# Patient Record
Sex: Male | Born: 1975 | Race: Black or African American | Hispanic: No | Marital: Single | Smoking: Current some day smoker
Health system: Southern US, Community
[De-identification: ages and names within clinical notes are randomized; demographics above are authoritative.]

## PROBLEM LIST (undated history)

## (undated) DIAGNOSIS — N200 Calculus of kidney: Secondary | ICD-10-CM

---

## 2011-09-29 ENCOUNTER — Emergency Department (HOSPITAL_COMMUNITY)
Admission: EM | Admit: 2011-09-29 | Discharge: 2011-09-29 | Disposition: A | Discharge status: Home / Self Care | Payer: Self-pay | Attending: Emergency Medicine | Admitting: Emergency Medicine

## 2011-09-29 DIAGNOSIS — R079 Chest pain, unspecified: Secondary | ICD-10-CM | POA: Insufficient documentation

## 2011-09-29 DIAGNOSIS — Z79899 Other long term (current) drug therapy: Secondary | ICD-10-CM | POA: Insufficient documentation

## 2011-09-29 DIAGNOSIS — R197 Diarrhea, unspecified: Secondary | ICD-10-CM | POA: Insufficient documentation

## 2011-09-29 DIAGNOSIS — J069 Acute upper respiratory infection, unspecified: Secondary | ICD-10-CM | POA: Insufficient documentation

## 2011-09-29 DIAGNOSIS — K5289 Other specified noninfective gastroenteritis and colitis: Secondary | ICD-10-CM | POA: Insufficient documentation

## 2011-09-29 DIAGNOSIS — R059 Cough, unspecified: Secondary | ICD-10-CM | POA: Insufficient documentation

## 2011-09-29 DIAGNOSIS — R05 Cough: Secondary | ICD-10-CM | POA: Insufficient documentation

## 2011-09-29 DIAGNOSIS — M549 Dorsalgia, unspecified: Secondary | ICD-10-CM | POA: Insufficient documentation

## 2011-09-29 DIAGNOSIS — R509 Fever, unspecified: Secondary | ICD-10-CM | POA: Insufficient documentation

## 2011-09-29 DIAGNOSIS — K529 Noninfective gastroenteritis and colitis, unspecified: Secondary | ICD-10-CM

## 2011-09-29 MED ORDER — AZITHROMYCIN 250 MG PO TABS: 250.0000 mg | ORAL_TABLET | Freq: Every day | ORAL | Status: AC

## 2011-09-29 NOTE — ED Provider Notes (Signed)
This chart was scribed for Darrell Jakes, MD by Darrell Moss. The patient was seen in room APFT21/APFT21 and the patient's care was started at 3:57 PM.   CSN: [id number] Arrival date & time: 09/29/2011  2:19 PM   First MD Initiated Contact with Patient 09/29/11 1549      Chief Complaint  Patient presents with  . Cough  . Back Pain    (Consider location/radiation/quality/duration/timing/severity/associated sxs/prior treatment) Patient is a 35 y.o. male presenting with cough and back pain. The history is provided by the patient.  Cough This is a new problem. The current episode started more than 2 days ago. The problem occurs constantly. The problem has been gradually worsening. The cough is productive of sputum. The maximum temperature recorded prior to his arrival was 100 to 100.9 F. The fever has been present for 1 to 2 days. Associated symptoms include chest pain. Pertinent negatives include no ear pain, no headaches and no myalgias. He has tried nothing for the symptoms.  Back Pain  Associated symptoms include chest pain and a fever. Pertinent negatives include no headaches.   3:57 PM Sergei Delo is a 35 y.o. male who presents to the Emergency Department complaining of sudden onset, constant, productive cough that began 2 days ago. Cough is productive of light green phlegm. Pt c/o associated mild fever, upper back pain, and diarrhea, but has had no episodes of diarrhea today. Pt c/o chest and upper back pain when coughing.  Pt denies vomiting, blood in diarrhea, abdominal pain, sore throat, headache, aches, rash, swelling in ankles, neck pain. Pt confirms positive sick contact.    History reviewed. No pertinent past medical history.  History reviewed. No pertinent past surgical history.  No family history on file.  History  Substance Use Topics  . Smoking status: Current Everyday Smoker  . Smokeless tobacco: Not on file  . Alcohol Use: No      Review of Systems    Constitutional: Positive for fever.  HENT: Negative for ear pain and neck pain.   Respiratory: Positive for cough.   Cardiovascular: Positive for chest pain.  Musculoskeletal: Positive for back pain. Negative for myalgias.  Neurological: Negative for headaches.   10 Systems reviewed and are negative for acute change except as noted in the HPI.  Allergies  Review of patient's allergies indicates no known allergies.  Home Medications   Current Outpatient Rx  Name Route Sig Dispense Refill  . ACETAMINOPHEN 500 MG PO TABS Oral Take 500 mg by mouth every 6 (six) hours as needed. Pain     . CHLORPHEN-PSEUDOEPHED-APAP 2-30-500 MG PO TABS Oral Take 1 tablet by mouth every 4 (four) hours as needed.      Marland Kitchen DAYQUIL PO Oral Take 2 capsules by mouth every 8 (eight) hours as needed. Cold Symptoms     . AZITHROMYCIN 250 MG PO TABS Oral Take 1 tablet (250 mg total) by mouth daily. Take first 2 tablets together, then 1 every day until finished. 6 tablet 0    BP 143/83  Pulse 94  Temp(Src) 99.9 F (37.7 C) (Oral)  Resp 22  Ht 5\' 11"  (1.803 m)  Wt 304 lb (137.893 kg)  BMI 42.40 kg/m2  SpO2 98%  Physical Exam  Nursing note and vitals reviewed. Constitutional: He is oriented to person, place, and time. He appears well-developed and well-nourished. No distress.  HENT:  Head: Normocephalic and atraumatic.  Eyes: EOM are normal. Pupils are equal, round, and reactive to light.  Neck:  Normal range of motion. Neck supple. No tracheal deviation present.  Cardiovascular: Normal rate, regular rhythm and normal heart sounds.   Pulmonary/Chest: Effort normal and breath sounds normal. No respiratory distress. He has no wheezes.  Abdominal: Soft. Bowel sounds are normal. He exhibits no distension.  Musculoskeletal: Normal range of motion. He exhibits no edema.  Lymphadenopathy:    He has no cervical adenopathy.  Neurological: He is alert and oriented to person, place, and time. No sensory deficit.   Skin: Skin is warm and dry.  Psychiatric: He has a normal mood and affect. His behavior is normal.    ED Course  Procedures (including critical care time) DIAGNOSTIC STUDIES: Oxygen Saturation is 98% on room air, normal by my interpretation.    COORDINATION OF CARE:     Labs Reviewed - No data to display No results found.   1. Upper respiratory infection   2. Gastroenteritis       MDM  Patient with combination of symptoms that included rest for infection and some vomiting and diarrhea vomiting and diarrhea has improved, cough is productive of some muscular pain with coughing to the chest and back, sitting chest x-ray will give a course of Zithromax will cover of pneumonia this may very well be overall viral illness. Patient is nontoxic in no acute distress. Also recommended starting the Motrin Aleve Advil or Naprosyn, along with Robitussin-DM for the cough.     I personally performed the services described in this documentation, which was scribed in my presence. The recorded information has been reviewed and considered.         Darrell Jakes, MD 09/29/11 [phone]

## 2011-09-29 NOTE — ED Notes (Signed)
Pt c/o cough x 3 days with low grade fever.  Also c/o upper back pain.  Pt says has had some episodes of diarrhea x 3 days.

## 2012-11-17 ENCOUNTER — Emergency Department (HOSPITAL_COMMUNITY): Payer: Self-pay

## 2012-11-17 ENCOUNTER — Emergency Department (HOSPITAL_COMMUNITY)
Admission: EM | Admit: 2012-11-17 | Discharge: 2012-11-17 | Disposition: A | Discharge status: Home / Self Care | Payer: Self-pay | Attending: Emergency Medicine | Admitting: Emergency Medicine

## 2012-11-17 ENCOUNTER — Encounter (HOSPITAL_COMMUNITY): Payer: Self-pay

## 2012-11-17 DIAGNOSIS — R61 Generalized hyperhidrosis: Secondary | ICD-10-CM | POA: Insufficient documentation

## 2012-11-17 DIAGNOSIS — R110 Nausea: Secondary | ICD-10-CM | POA: Insufficient documentation

## 2012-11-17 DIAGNOSIS — N201 Calculus of ureter: Secondary | ICD-10-CM | POA: Insufficient documentation

## 2012-11-17 DIAGNOSIS — F17200 Nicotine dependence, unspecified, uncomplicated: Secondary | ICD-10-CM | POA: Insufficient documentation

## 2012-11-17 LAB — CBC WITH DIFFERENTIAL/PLATELET
Basophils Absolute: 0 10*3/uL (ref 0.0–0.1)
Basophils Relative: 1 % (ref 0–1)
Eosinophils Absolute: 0.2 10*3/uL (ref 0.0–0.7)
Hemoglobin: 15.1 g/dL (ref 13.0–17.0)
MCH: 29.8 pg (ref 26.0–34.0)
MCHC: 34.6 g/dL (ref 30.0–36.0)
Neutro Abs: 4.1 10*3/uL (ref 1.7–7.7)
Neutrophils Relative %: 50 % (ref 43–77)
Platelets: 355 10*3/uL (ref 150–400)

## 2012-11-17 LAB — BASIC METABOLIC PANEL
Chloride: 101 mEq/L (ref 96–112)
GFR calc Af Amer: >90 mL/min (ref >90)
GFR calc non Af Amer: 87 mL/min — ABNORMAL LOW (ref >90)
Potassium: 3.6 mEq/L (ref 3.5–5.1)
Sodium: 137 mEq/L (ref 135–145)

## 2012-11-17 LAB — URINALYSIS, ROUTINE W REFLEX MICROSCOPIC
Bilirubin Urine: NEGATIVE
Leukocytes, UA: NEGATIVE
Nitrite: NEGATIVE
Specific Gravity, Urine: >1.03 — ABNORMAL HIGH (ref 1.005–1.030)
Urobilinogen, UA: 0.2 mg/dL (ref 0.0–1.0)
pH: 6 (ref 5.0–8.0)

## 2012-11-17 LAB — URINE MICROSCOPIC-ADD ON

## 2012-11-17 MED ORDER — TAMSULOSIN HCL 0.4 MG PO CAPS: ORAL_CAPSULE | ORAL | Status: DC

## 2012-11-17 MED ORDER — SODIUM CHLORIDE 0.9 % IV SOLN
Freq: Once | INTRAVENOUS | Status: AC
  Administered 2012-11-17: 1000 mL via INTRAVENOUS

## 2012-11-17 MED ORDER — PERCOCET 5-325 MG PO TABS: 1.0000 | ORAL_TABLET | Freq: Four times a day (QID) | ORAL | Status: DC | PRN

## 2012-11-17 MED ORDER — KETOROLAC TROMETHAMINE 30 MG/ML IJ SOLN
30.0000 mg | Freq: Once | INTRAMUSCULAR | Status: AC
  Administered 2012-11-17: 30 mg via INTRAVENOUS
  Filled 2012-11-17: qty 1

## 2012-11-17 MED ORDER — ONDANSETRON HCL 4 MG/2ML IJ SOLN
4.0000 mg | Freq: Once | INTRAMUSCULAR | Status: AC
  Administered 2012-11-17: 4 mg via INTRAVENOUS
  Filled 2012-11-17: qty 2

## 2012-11-17 MED ORDER — PROMETHAZINE HCL 25 MG PO TABS: 25.0000 mg | ORAL_TABLET | Freq: Four times a day (QID) | ORAL | Status: DC | PRN

## 2012-11-17 MED ORDER — MORPHINE SULFATE 4 MG/ML IJ SOLN
4.0000 mg | Freq: Once | INTRAMUSCULAR | Status: AC
  Administered 2012-11-17: 4 mg via INTRAVENOUS
  Filled 2012-11-17: qty 1

## 2012-11-17 MED ORDER — HYDROMORPHONE HCL PF 1 MG/ML IJ SOLN
1.0000 mg | Freq: Once | INTRAMUSCULAR | Status: AC
  Administered 2012-11-17: 1 mg via INTRAVENOUS
  Filled 2012-11-17: qty 1

## 2012-11-17 NOTE — ED Notes (Signed)
Pt reports has been having some left lower back pain on and off for several months.  Reports pain got much worse today.  Reports pain radiates around to left lower abd and urine has been dark.  Reports dry heaving this morning.  Denies history of kidney stones.  Pt took one of his wife's keflex this morning.

## 2012-11-17 NOTE — ED Provider Notes (Signed)
History   This chart was scribed for Ward Givens, MD by Charolett Bumpers, ED Scribe. The patient was seen in room APA04/APA04. Patient's care was started at 0836.   CSN: [id number]  Arrival date & time 11/17/12  0823   First MD Initiated Contact with Patient 11/17/12 [phone]      Chief Complaint  Patient presents with  . Flank Pain    The history is provided by the patient. No language interpreter was used.   Darrell Moss is a 37 y.o. male who presents to the Emergency Department complaining of sharp left-sided flank pain that started this morning around 7 am after waking that radiates into his lateral abdomen. He rates his pain 8/10 currently and 10/10 at it's worse. He reports he has had the same left flank pain intermittently for the past year but worsened this morning. He states the pain normally lasts for a couple days at a time and is aggravated with sleeping on his left side. He states that he has taken Tylenol and Ibuprofen with mild relief in the past. He states the pain was much worse this morning and is now aggravated with movements. He states that he feels he cannot have a BM and has had diaphoresis, nausea and dark urine that he noticed last night. He reports his urine has been intermittently dark over the past month. He denies any vomiting or fevers. He denies any recent increases in activity, falls or trauma. He denies any prior medical hx and has no regular medications. He is unsure of family h/o kidney stones. He felt at baseline yesterday.   No PCP.  History reviewed. No pertinent past medical history.  History reviewed. No pertinent past surgical history.  No family history on file. Both grandmothers with HTN, DM   History  Substance Use Topics  . Smoking status: Current Every Day Smoker  . Smokeless tobacco: Not on file  . Alcohol Use: Yes     Comment: occ  Admits to smoking a pack over 2-3 days Admits to occasional alcohol, drinks on weekends about 1  pint Unemployed Lives at home Lives with spouse  Review of Systems  Constitutional: Positive for diaphoresis. Negative for fever.  Gastrointestinal: Positive for nausea. Negative for vomiting.  Genitourinary: Positive for flank pain.       Dark urine  All other systems reviewed and are negative.    Allergies  Review of patient's allergies indicates no known allergies.  Home Medications   Current Outpatient Rx  Name  Route  Sig  Dispense  Refill  . ACETAMINOPHEN 500 MG PO TABS   Oral   Take 500 mg by mouth every 6 (six) hours as needed. Pain          . CHLORPHEN-PSEUDOEPHED-APAP 2-30-500 MG PO TABS   Oral   Take 1 tablet by mouth every 4 (four) hours as needed.           Marland Kitchen DAYQUIL PO   Oral   Take 2 capsules by mouth every 8 (eight) hours as needed. Cold Symptoms            Triage Vitals: BP 162/109  Pulse 80  Temp 98.2 F (36.8 C) (Oral)  Resp 20  Ht 5\' 11"  (1.803 m)  Wt 305 lb (138.347 kg)  BMI 42.54 kg/m2  SpO2 99%  Vital signs normal except hypertension   Physical Exam  Nursing note and vitals reviewed. Constitutional: He is oriented to person, place, and time. He appears  well-developed and well-nourished. No distress.       obese  HENT:  Head: Normocephalic and atraumatic.  Right Ear: External ear normal.  Left Ear: External ear normal.  Nose: Nose normal.  Mouth/Throat: Oropharynx is clear and moist. No oropharyngeal exudate.  Eyes: Conjunctivae normal and EOM are normal. Pupils are equal, round, and reactive to light.  Neck: Normal range of motion. Neck supple. No tracheal deviation present.  Cardiovascular: Normal rate, regular rhythm and normal heart sounds.  Exam reveals no gallop and no friction rub.   No murmur heard. Pulmonary/Chest: Effort normal and breath sounds normal. No respiratory distress. He has no wheezes. He has no rhonchi. He has no rales.  Abdominal: Soft. Bowel sounds are normal. He exhibits no distension. There is no  tenderness. There is no rebound and no guarding.         Area of pain indcated by patient  Musculoskeletal: Normal range of motion. He exhibits no edema and no tenderness.       Back:       No thoracic or lumbar spinal tenderness. Non-tender in left flank but states that is where pain is located. SLR was negative bilaterally. Some mild pain at ROM of waist to the left.   Area of pain indicated by patient noted  Neurological: He is alert and oriented to person, place, and time.  Skin: Skin is warm and dry.  Psychiatric: He has a normal mood and affect. His behavior is normal.    ED Course  Procedures (including critical care time)   Medications  morphine 4 MG/ML injection 4 mg (4 mg Intravenous Given 11/17/12 0942)  ondansetron (ZOFRAN) injection 4 mg (4 mg Intravenous Given 11/17/12 0942)  0.9 %  sodium chloride infusion (1000 mL Intravenous New Bag/Given 11/17/12 0943)  ketorolac (TORADOL) 30 MG/ML injection 30 mg (30 mg Intravenous Given 11/17/12 1005)  HYDROmorphone (DILAUDID) injection 1 mg (1 mg Intravenous Given 11/17/12 1006)    DIAGNOSTIC STUDIES: Oxygen Saturation is 99% on room air, normal by my interpretation.    COORDINATION OF CARE:  08:55-Discussed planned course of treatment with the patient including CT of abdomen, blood work and UA, who is agreeable at this time.   09:45-Recheck: Pt just received pain medication. Informed him of lab and imaging results. Blood sugar is 142, he states that he hasn't eaten anything today. Advised to monitor this. CT scan showed a partially obstructing 3 mm calculus in the mid left ureter. He states that he excessively drinks caffeine. We discussed cutting back on the caffeine.   11:08-He states that his pain has improved with pain medication. He rates his pain 6/10 laying down. Explained pain scale and he still rates the pain 6/10. He appears comfortable watching TV and is sitting in bed eating ice.    11:25-Nurse states pt is rating his  pain 0/10 and is ready for d/c.  Results for orders placed during the hospital encounter of 11/17/12  CBC WITH DIFFERENTIAL      Component Value Range   WBC 8.1  4.0 - 10.5 K/uL   RBC 5.06  4.22 - 5.81 MIL/uL   Hemoglobin 15.1  13.0 - 17.0 g/dL   HCT 25.3  66.4 - 40.3 %   MCV 86.2  78.0 - 100.0 fL   MCH 29.8  26.0 - 34.0 pg   MCHC 34.6  30.0 - 36.0 g/dL   RDW 47.4  25.9 - 56.3 %   Platelets 355  150 - 400 K/uL  Neutrophils Relative 50  43 - 77 %   Neutro Abs 4.1  1.7 - 7.7 K/uL   Lymphocytes Relative 38  12 - 46 %   Lymphs Abs 3.1  0.7 - 4.0 K/uL   Monocytes Relative 9  3 - 12 %   Monocytes Absolute 0.8  0.1 - 1.0 K/uL   Eosinophils Relative 2  0 - 5 %   Eosinophils Absolute 0.2  0.0 - 0.7 K/uL   Basophils Relative 1  0 - 1 %   Basophils Absolute 0.0  0.0 - 0.1 K/uL  BASIC METABOLIC PANEL      Component Value Range   Sodium 137  135 - 145 mEq/L   Potassium 3.6  3.5 - 5.1 mEq/L   Chloride 101  96 - 112 mEq/L   CO2 25  19 - 32 mEq/L   Glucose, Bld 142 (*) 70 - 99 mg/dL   BUN 13  6 - 23 mg/dL   Creatinine, Ser 1.61  0.50 - 1.35 mg/dL   Calcium 9.3  8.4 - 09.6 mg/dL   GFR calc non Af Amer 87 (*) >90 mL/min   GFR calc Af Amer >90  >90 mL/min  URINALYSIS, ROUTINE W REFLEX MICROSCOPIC      Component Value Range   Color, Urine YELLOW  YELLOW   APPearance CLEAR  CLEAR   Specific Gravity, Urine >1.030 (*) 1.005 - 1.030   pH 6.0  5.0 - 8.0   Glucose, UA NEGATIVE  NEGATIVE mg/dL   Hgb urine dipstick LARGE (*) NEGATIVE   Bilirubin Urine NEGATIVE  NEGATIVE   Ketones, ur NEGATIVE  NEGATIVE mg/dL   Protein, ur TRACE (*) NEGATIVE mg/dL   Urobilinogen, UA 0.2  0.0 - 1.0 mg/dL   Nitrite NEGATIVE  NEGATIVE   Leukocytes, UA NEGATIVE  NEGATIVE  URINE MICROSCOPIC-ADD ON      Component Value Range   RBC / HPF TOO NUMEROUS TO COUNT  <3 RBC/hpf    Laboratory interpretation all normal except hematuria   Ct Abdomen Pelvis Wo Contrast  11/17/2012  *RADIOLOGY REPORT*  Clinical Data: Left  flank pain.  CT ABDOMEN AND PELVIS WITHOUT CONTRAST  Technique:  Multidetector CT imaging of the abdomen and pelvis was performed following the standard protocol without intravenous contrast.  Comparison: None.  Findings: The lung bases are clear.  There is no pleural effusion.  No renal calculi are demonstrated.  There is mild asymmetric dilatation of the left proximal ureter with mild periureteral soft tissue stranding.  There is a 3 mm calculus in the mid left ureter (image 54).  This is located at the L4 level and is not apparent on the patient's scout image.  No other urinary tract calculi are demonstrated.  The bladder appears unremarkable.  There are no focal renal abnormalities.  There is mild hepatic low density consistent with steatosis.  No focal liver lesions are identified.  As evaluated in the noncontrast state, the gallbladder, pancreas, spleen and adrenal glands appear normal.  There are no inflammatory changes.  The appendix appears normal.  Faint calcifications are noted peripherally in the right prostate lobe.  IMPRESSION:  1.  Partially obstructing 3 mm calculus in the mid left ureter. 2.  No renal or other urinary tract calculi identified. 3.  Mild hepatic steatosis.   Original Report Authenticated By: Carey Bullocks, M.D.      1. Ureteral stone     New Prescriptions   PERCOCET 5-325 MG PER TABLET    Take  1 tablet by mouth every 6 (six) hours as needed for pain.   PROMETHAZINE (PHENERGAN) 25 MG TABLET    Take 1 tablet (25 mg total) by mouth every 6 (six) hours as needed for nausea.   TAMSULOSIN HCL (FLOMAX) 0.4 MG CAPS    Take 1 po QD until you pass the stone.    Plan discharge  Devoria Albe, MD, FACEP  MDM   I personally performed the services described in this documentation, which was scribed in my presence. The recorded information has been reviewed and considered.  Devoria Albe, MD, FACEP      Ward Givens, MD 11/17/12 1140

## 2012-11-17 NOTE — ED Notes (Signed)
Pt reports waking this am w/ left flank pain, pain radiates to lower ab area. +nausea and pain. Dark urine. Took keflex this am that was not his.  Wife reports that pt has been c/o back pain off/on for months.

## 2014-07-12 ENCOUNTER — Emergency Department (HOSPITAL_COMMUNITY): Payer: Self-pay

## 2014-07-12 ENCOUNTER — Emergency Department (HOSPITAL_COMMUNITY)
Admission: EM | Admit: 2014-07-12 | Discharge: 2014-07-12 | Disposition: A | Discharge status: Home / Self Care | Payer: Self-pay | Attending: Emergency Medicine | Admitting: Emergency Medicine

## 2014-07-12 ENCOUNTER — Encounter (HOSPITAL_COMMUNITY): Payer: Self-pay | Admitting: Emergency Medicine

## 2014-07-12 DIAGNOSIS — M25559 Pain in unspecified hip: Secondary | ICD-10-CM | POA: Insufficient documentation

## 2014-07-12 DIAGNOSIS — F17200 Nicotine dependence, unspecified, uncomplicated: Secondary | ICD-10-CM | POA: Insufficient documentation

## 2014-07-12 DIAGNOSIS — M25551 Pain in right hip: Secondary | ICD-10-CM

## 2014-07-12 MED ORDER — DEXAMETHASONE 4 MG PO TABS: ORAL_TABLET | ORAL | Status: DC

## 2014-07-12 MED ORDER — DICLOFENAC SODIUM 75 MG PO TBEC: 75.0000 mg | DELAYED_RELEASE_TABLET | Freq: Two times a day (BID) | ORAL | Status: DC

## 2014-07-12 NOTE — Discharge Instructions (Signed)
Please use Decadron and diclofenac daily with food. Please see your primary physician, or the orthopedic specialist listed above if not improving in the next 7-10 days. Hip Pain Your hip is the joint between your upper legs and your lower pelvis. The bones, cartilage, tendons, and muscles of your hip joint perform a lot of work each day supporting your body weight and allowing you to move around. Hip pain can range from a minor ache to severe pain in one or both of your hips. Pain may be felt on the inside of the hip joint near the groin, or the outside near the buttocks and upper thigh. You may have swelling or stiffness as well.  HOME CARE INSTRUCTIONS   Take medicines only as directed by your health care provider.  Apply ice to the injured area:  Put ice in a plastic bag.  Place a towel between your skin and the bag.  Leave the ice on for 15-20 minutes at a time, 3-4 times a day.  Keep your leg raised (elevated) when possible to lessen swelling.  Avoid activities that cause pain.  Follow specific exercises as directed by your health care provider.  Sleep with a pillow between your legs on your most comfortable side.  Record how often you have hip pain, the location of the pain, and what it feels like. SEEK MEDICAL CARE IF:   You are unable to put weight on your leg.  Your hip is red or swollen or very tender to touch.  Your pain or swelling continues or worsens after 1 week.  You have increasing difficulty walking.  You have a fever. SEEK IMMEDIATE MEDICAL CARE IF:   You have fallen.  You have a sudden increase in pain and swelling in your hip. MAKE SURE YOU:   Understand these instructions.  Will watch your condition.  Will get help right away if you are not doing well or get worse. Document Released: 03/19/2010 Document Revised: 02/13/2014 Document Reviewed: 05/26/2013 [facility] Patient Information 2015 [place], [place]. This information is not intended to replace  advice given to you by your health care provider. Make sure you discuss any questions you have with your health care provider.

## 2014-07-12 NOTE — ED Provider Notes (Signed)
CSN: [id number]     Arrival date & time 07/12/14  1253 History   First MD Initiated Contact with Patient 07/12/14 1415     Chief Complaint  Patient presents with  . Hip Pain     (Consider location/radiation/quality/duration/timing/severity/associated sxs/prior Treatment) HPI Comments: Patient is a 38 year old male who presents to the emergency department with the complaint of right hip pain. The patient states that this is been going on for about 4 days. 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 The patient has not had any operations or procedures involving the hip or pelvis. Patient describes a burning and tingling pain in the hip with certain movement. The pain at times goes from the hip into the legs. He presents now for assistance with this problem.  The history is provided by the patient.    History reviewed. No pertinent past medical history. History reviewed. No pertinent past surgical history. History reviewed. No pertinent family history. History  Substance Use Topics  . Smoking status: Current Every Day Smoker    Types: Cigarettes  . Smokeless tobacco: Not on file  . Alcohol Use: Yes     Comment: occ    Review of Systems  Constitutional: Negative for activity change.       All ROS Neg except as noted in HPI  HENT: Negative for nosebleeds.   Eyes: Negative for photophobia and discharge.  Respiratory: Negative for cough, shortness of breath and wheezing.   Cardiovascular: Negative for chest pain and palpitations.  Gastrointestinal: Negative for abdominal pain and blood in stool.  Genitourinary: Negative for dysuria, frequency and hematuria.  Musculoskeletal: Negative for arthralgias, back pain and neck pain.  Skin: Negative.   Neurological: Negative for dizziness, seizures and speech difficulty.  Psychiatric/Behavioral: Negative for hallucinations and  confusion.      Allergies  Review of patient's allergies indicates no known allergies.  Home Medications   Prior to Admission medications   Medication Sig Start Date End Date Taking? Authorizing Provider  ibuprofen (ADVIL,MOTRIN) 200 MG tablet Take 1,400 mg by mouth daily as needed for moderate pain.   Yes Historical Provider, MD   BP 156/89  Pulse 96  Temp(Src) 98.1 F (36.7 C) (Oral)  Resp 18  Ht 5\' 10"  (1.778 m)  Wt 311 lb (141.069 kg)  BMI 44.62 kg/m2  SpO2 99% Physical Exam  Nursing note and vitals reviewed. Constitutional: He is oriented to person, place, and time. He appears well-developed and well-nourished.  Non-toxic appearance.  HENT:  Head: Normocephalic.  Right Ear: Tympanic membrane and external ear normal.  Left Ear: Tympanic membrane and external ear normal.  Eyes: EOM and lids are normal. Pupils are equal, round, and reactive to light.  Neck: Normal range of motion. Neck supple. Carotid bruit is not present.  Cardiovascular: Normal rate, regular rhythm, normal heart sounds, intact distal pulses and normal pulses.   Pulmonary/Chest: Breath sounds normal. No respiratory distress.  Abdominal: Soft. Bowel sounds are normal. There is no tenderness. There is no guarding.  Musculoskeletal: Normal range of motion.  There is good range of motion of the right and left hip, right and left knee, right and left ankle and toes. There no hot joints appreciated, and particularly right hip is not hot. There is no evidence for dislocation. The dorsalis pedis and posterior tibial pulses are 2+ bilaterally. There is mild to moderate  pain and mild crepitus with range of motion of the right hip.  Lymphadenopathy:       Head (right side): No submandibular adenopathy present.       Head (left side): No submandibular adenopathy present.    He has no cervical adenopathy.  Neurological: He is alert and oriented to person, place, and time. He has normal strength. No cranial nerve  deficit or sensory deficit.  Skin: Skin is warm and dry.  Psychiatric: He has a normal mood and affect. His speech is normal.    ED Course  Procedures (including critical care time) Labs Review Labs Reviewed - No data to display  Imaging Review Dg Hip Complete Right  07/12/2014   CLINICAL DATA:  Right hip pain radiates to thigh.  EXAM: RIGHT HIP - COMPLETE 2+ VIEW  COMPARISON:  None.  FINDINGS: Frontal pelvis shows no fracture. Arcuate lines of the sacrum are preserved. SI joints and symphysis pubis are normal. Joint space in the hips is well preserved and symmetric. AP and frog-leg lateral views of the right hip show no evidence for femoral neck fracture.  IMPRESSION: No acute bony findings.   Electronically Signed   By: Kennith CenterEric  Mansell M.D.   On: 07/12/2014 15:41     EKG Interpretation None      MDM  X-ray of the right hip and pelvis is negative for fracture or dislocation.  The examination and history are consistent with musculoskeletal pain. The patient will be prescribed Decadron 2 times daily and diclofenac 2 times daily. I've instructed the patient to rest his hip is much as possible. I've also referred the patient to Dr. Romeo AppleHarrison (orthopedics) for additional evaluation and management.    Final diagnoses:  Hip pain, right    **I have reviewed nursing notes, vital signs, and all appropriate lab and imaging results for this patient.Kathie Dike*    Brallan Denio M Jovian Lembcke, PA-C 07/12/14 [phone]

## 2014-07-12 NOTE — ED Notes (Signed)
Pt c/o right hip pain, denies any injury, pt able to ambulate to triage room

## 2014-07-12 NOTE — ED Provider Notes (Signed)
Medical screening examination/treatment/procedure(s) were performed by non-physician practitioner and as supervising physician I was immediately available for consultation/collaboration.  Gerhard Munchobert Morganna Styles, MD 07/12/14 [phone]

## 2015-07-22 ENCOUNTER — Emergency Department (HOSPITAL_COMMUNITY)
Admission: EM | Admit: 2015-07-22 | Discharge: 2015-07-22 | Disposition: A | Discharge status: Home / Self Care | Payer: Self-pay | Attending: Emergency Medicine | Admitting: Emergency Medicine

## 2015-07-22 ENCOUNTER — Encounter (HOSPITAL_COMMUNITY): Payer: Self-pay | Admitting: Emergency Medicine

## 2015-07-22 ENCOUNTER — Emergency Department (HOSPITAL_COMMUNITY): Payer: Self-pay

## 2015-07-22 DIAGNOSIS — R61 Generalized hyperhidrosis: Secondary | ICD-10-CM | POA: Insufficient documentation

## 2015-07-22 DIAGNOSIS — N200 Calculus of kidney: Secondary | ICD-10-CM | POA: Insufficient documentation

## 2015-07-22 DIAGNOSIS — K5900 Constipation, unspecified: Secondary | ICD-10-CM | POA: Insufficient documentation

## 2015-07-22 DIAGNOSIS — Z791 Long term (current) use of non-steroidal anti-inflammatories (NSAID): Secondary | ICD-10-CM | POA: Insufficient documentation

## 2015-07-22 DIAGNOSIS — Z720 Tobacco use: Secondary | ICD-10-CM | POA: Insufficient documentation

## 2015-07-22 DIAGNOSIS — R52 Pain, unspecified: Secondary | ICD-10-CM

## 2015-07-22 HISTORY — DX: Calculus of kidney: N20.0

## 2015-07-22 LAB — URINALYSIS, ROUTINE W REFLEX MICROSCOPIC
Bilirubin Urine: NEGATIVE
GLUCOSE, UA: NEGATIVE mg/dL
KETONES UR: NEGATIVE mg/dL
Nitrite: NEGATIVE
PROTEIN: 30 mg/dL — AB
Specific Gravity, Urine: 1.025 (ref 1.005–1.030)
UROBILINOGEN UA: 0.2 mg/dL (ref 0.0–1.0)
pH: 6.5 (ref 5.0–8.0)

## 2015-07-22 LAB — CBC
HCT: 43.6 % (ref 39.0–52.0)
HEMOGLOBIN: 15 g/dL (ref 13.0–17.0)
MCH: 29.4 pg (ref 26.0–34.0)
MCHC: 34.4 g/dL (ref 30.0–36.0)
MCV: 85.3 fL (ref 78.0–100.0)
PLATELETS: 365 10*3/uL (ref 150–400)
RBC: 5.11 MIL/uL (ref 4.22–5.81)
RDW: 14 % (ref 11.5–15.5)
WBC: 7.7 10*3/uL (ref 4.0–10.5)

## 2015-07-22 LAB — BASIC METABOLIC PANEL
ANION GAP: 8 (ref 5–15)
BUN: 14 mg/dL (ref 6–20)
CALCIUM: 9 mg/dL (ref 8.9–10.3)
CO2: 27 mmol/L (ref 22–32)
CREATININE: 1.3 mg/dL — AB (ref 0.61–1.24)
Chloride: 105 mmol/L (ref 101–111)
Glucose, Bld: 128 mg/dL — ABNORMAL HIGH (ref 65–99)
Potassium: 3.4 mmol/L — ABNORMAL LOW (ref 3.5–5.1)
SODIUM: 140 mmol/L (ref 135–145)

## 2015-07-22 LAB — URINE MICROSCOPIC-ADD ON

## 2015-07-22 LAB — HEPATIC FUNCTION PANEL
ALT: 40 U/L (ref 17–63)
AST: 32 U/L (ref 15–41)
Albumin: 4.4 g/dL (ref 3.5–5.0)
Alkaline Phosphatase: 65 U/L (ref 38–126)
BILIRUBIN DIRECT: 0.1 mg/dL (ref 0.1–0.5)
BILIRUBIN INDIRECT: 0.6 mg/dL (ref 0.3–0.9)
TOTAL PROTEIN: 8.5 g/dL — AB (ref 6.5–8.1)
Total Bilirubin: 0.7 mg/dL (ref 0.3–1.2)

## 2015-07-22 MED ORDER — OXYCODONE-ACETAMINOPHEN 5-325 MG PO TABS: 1.0000 | ORAL_TABLET | Freq: Four times a day (QID) | ORAL | Status: DC | PRN

## 2015-07-22 MED ORDER — ONDANSETRON HCL 4 MG/2ML IJ SOLN
4.0000 mg | Freq: Once | INTRAMUSCULAR | Status: AC
  Administered 2015-07-22: 4 mg via INTRAVENOUS
  Filled 2015-07-22: qty 2

## 2015-07-22 MED ORDER — HYDROMORPHONE HCL 1 MG/ML IJ SOLN
1.0000 mg | Freq: Once | INTRAMUSCULAR | Status: AC
  Administered 2015-07-22: 1 mg via INTRAVENOUS
  Filled 2015-07-22: qty 1

## 2015-07-22 MED ORDER — CEPHALEXIN 500 MG PO CAPS: 500.0000 mg | ORAL_CAPSULE | Freq: Four times a day (QID) | ORAL | Status: DC

## 2015-07-22 MED ORDER — ONDANSETRON 4 MG PO TBDP: ORAL_TABLET | ORAL | Status: DC

## 2015-07-22 MED ORDER — KETOROLAC TROMETHAMINE 30 MG/ML IJ SOLN
30.0000 mg | Freq: Once | INTRAMUSCULAR | Status: AC
  Administered 2015-07-22: 30 mg via INTRAVENOUS
  Filled 2015-07-22: qty 1

## 2015-07-22 NOTE — ED Notes (Signed)
Pt states that he woke up this morning with left flank pain.

## 2015-07-22 NOTE — ED Provider Notes (Signed)
CSN: [id number]     Arrival date & time 07/22/15  1012 History  By signing my name below, I, Murriel Hopper, attest that this documentation has been prepared under the direction and in the presence of Bethann Berkshire, MD. Electronically Signed: Murriel Hopper, ED Scribe. 07/22/2015. 10:52 AM.    Chief Complaint  Patient presents with  . Flank Pain      Patient is a 39 y.o. male presenting with flank pain. The history is provided by the patient. No language interpreter was used.  Flank Pain This is a new problem. The current episode started 1 to 2 hours ago. The problem occurs constantly. The problem has been gradually worsening. Associated symptoms include abdominal pain. Pertinent negatives include no chest pain and no headaches.   HPI Comments: Darrell Moss is a 39 y.o. male who presents to the Emergency Department complaining of constant, worsening left flank pain with associated constipation and diaphoresis that has been present since this morning. Pt states that his onset of pain was very sudden. Pt states it feels similar to the last time that he had a kidney stone. Pt denies having vomiting, nausea, or fevers, or allergies to any medications.     Past Medical History  Diagnosis Date  . Kidney calculi    History reviewed. No pertinent past surgical history. History reviewed. No pertinent family history. Social History  Substance Use Topics  . Smoking status: Current Every Day Smoker -- 0.50 packs/day    Types: Cigarettes  . Smokeless tobacco: None  . Alcohol Use: Yes     Comment: occ    Review of Systems  Constitutional: Positive for diaphoresis. Negative for fever, appetite change and fatigue.  HENT: Negative for congestion, ear discharge and sinus pressure.   Eyes: Negative for discharge.  Respiratory: Negative for cough.   Cardiovascular: Negative for chest pain.  Gastrointestinal: Positive for abdominal pain and constipation. Negative for nausea, vomiting and diarrhea.   Genitourinary: Positive for flank pain. Negative for frequency and hematuria.  Musculoskeletal: Negative for back pain.  Skin: Negative for rash.  Neurological: Negative for seizures and headaches.  Psychiatric/Behavioral: Negative for hallucinations.      Allergies  Review of patient's allergies indicates no known allergies.  Home Medications   Prior to Admission medications   Medication Sig Start Date End Date Taking? Authorizing Provider  dexamethasone (DECADRON) 4 MG tablet 1 po bid with food 07/12/14   Ivery Quale, PA-C  diclofenac (VOLTAREN) 75 MG EC tablet Take 1 tablet (75 mg total) by mouth 2 (two) times daily. 07/12/14   Ivery Quale, PA-C  ibuprofen (ADVIL,MOTRIN) 200 MG tablet Take 1,400 mg by mouth daily as needed for moderate pain.    Historical Provider, MD   BP 111/90 mmHg  Pulse 74  Temp(Src) 97.9 F (36.6 C) (Oral)  Resp 17  Ht  (1.803 m)  Wt 300 lb (136.079 kg)  BMI 41.86 kg/m2  SpO2 99% Physical Exam  Constitutional: He is oriented to person, place, and time. He appears well-developed. He appears distressed.  Moderate distress   HENT:  Head: Normocephalic.  Eyes: Conjunctivae and EOM are normal. No scleral icterus.  Neck: Neck supple. No thyromegaly present.  Cardiovascular: Normal rate and regular rhythm.  Exam reveals no gallop and no friction rub.   No murmur heard. Pulmonary/Chest: No stridor. He has no wheezes. He has no rales. He exhibits no tenderness.  Abdominal: He exhibits no distension. There is no tenderness. There is no rebound.  Moderate  LLQ and left flank tenderness  Musculoskeletal: Normal range of motion. He exhibits no edema.  Lymphadenopathy:    He has no cervical adenopathy.  Neurological: He is oriented to person, place, and time. He exhibits normal muscle tone. Coordination normal.  Skin: No rash noted. No erythema.  Psychiatric: He has a normal mood and affect. His behavior is normal.    ED Course  Procedures  (including critical care time)  DIAGNOSTIC STUDIES: Oxygen Saturation is 99% on room air, normal by my interpretation.    COORDINATION OF CARE: 10:49 AM Discussed treatment plan with pt at bedside and pt agreed to plan.   Labs Review Labs Reviewed  BASIC METABOLIC PANEL  CBC  URINALYSIS, ROUTINE W REFLEX MICROSCOPIC (NOT AT [facility])    Imaging Review No results found. I have personally reviewed and evaluated these images and lab results as part of my medical decision-making.   EKG Interpretation None      MDM   Final diagnoses:  None   Patient has a large obstructing kidney stone on the left side. Creatinine mildly elevated at 1.3. Urinalysis shows too numerous to count red cells and small amount of white cells. Patient's pain improved with narcotics. Patient is sent home with pain and nausea medicine and covered with Keflex. It was stressed to the patient that he had to be seen by urology this week. He was also told if he developed fever vomiting or severe pain that he should go to [facility].   The chart was scribed for me under my direct supervision.  I personally performed the history, physical, and medical decision making and all procedures in the evaluation of this patient.Bethann Berkshire, MD 07/22/15 [phone]

## 2015-07-22 NOTE — Discharge Instructions (Signed)
Call [facility] Monday to be followed up this week. Patient needs to return to [facility] emergency department if he develops fever vomiting or severe pain

## 2015-07-23 LAB — URINE CULTURE: Culture: NO GROWTH

## 2016-03-15 ENCOUNTER — Emergency Department (HOSPITAL_COMMUNITY): Payer: Self-pay

## 2016-03-15 ENCOUNTER — Encounter (HOSPITAL_COMMUNITY): Payer: Self-pay | Admitting: Emergency Medicine

## 2016-03-15 ENCOUNTER — Emergency Department (HOSPITAL_COMMUNITY)
Admission: EM | Admit: 2016-03-15 | Discharge: 2016-03-15 | Disposition: A | Discharge status: Home / Self Care | Payer: Self-pay | Attending: Emergency Medicine | Admitting: Emergency Medicine

## 2016-03-15 DIAGNOSIS — F17210 Nicotine dependence, cigarettes, uncomplicated: Secondary | ICD-10-CM | POA: Insufficient documentation

## 2016-03-15 DIAGNOSIS — Z791 Long term (current) use of non-steroidal anti-inflammatories (NSAID): Secondary | ICD-10-CM | POA: Insufficient documentation

## 2016-03-15 DIAGNOSIS — N200 Calculus of kidney: Secondary | ICD-10-CM | POA: Insufficient documentation

## 2016-03-15 LAB — URINALYSIS, ROUTINE W REFLEX MICROSCOPIC
Bilirubin Urine: NEGATIVE
Glucose, UA: NEGATIVE mg/dL
Leukocytes, UA: NEGATIVE
Nitrite: NEGATIVE
Protein, ur: 100 mg/dL — AB
Specific Gravity, Urine: >1.03 — ABNORMAL HIGH (ref 1.005–1.030)
pH: 6 (ref 5.0–8.0)

## 2016-03-15 LAB — URINE MICROSCOPIC-ADD ON

## 2016-03-15 MED ORDER — HYDROMORPHONE HCL 1 MG/ML IJ SOLN
1.0000 mg | Freq: Once | INTRAMUSCULAR | Status: DC
  Filled 2016-03-15: qty 1

## 2016-03-15 MED ORDER — IBUPROFEN 800 MG PO TABS: 800.0000 mg | ORAL_TABLET | Freq: Three times a day (TID) | ORAL | Status: DC

## 2016-03-15 MED ORDER — OXYCODONE-ACETAMINOPHEN 5-325 MG PO TABS: 1.0000 | ORAL_TABLET | ORAL | Status: DC | PRN

## 2016-03-15 MED ORDER — HYDROMORPHONE HCL 1 MG/ML IJ SOLN
1.0000 mg | Freq: Once | INTRAMUSCULAR | Status: AC
  Administered 2016-03-15: 1 mg via INTRAMUSCULAR

## 2016-03-15 MED ORDER — TAMSULOSIN HCL 0.4 MG PO CAPS: 0.4000 mg | ORAL_CAPSULE | Freq: Two times a day (BID) | ORAL | Status: DC

## 2016-03-15 MED ORDER — PROMETHAZINE HCL 25 MG PO TABS: 25.0000 mg | ORAL_TABLET | Freq: Four times a day (QID) | ORAL | Status: DC | PRN

## 2016-03-15 MED ORDER — KETOROLAC TROMETHAMINE 60 MG/2ML IM SOLN
60.0000 mg | Freq: Once | INTRAMUSCULAR | Status: AC
  Administered 2016-03-15: 60 mg via INTRAMUSCULAR
  Filled 2016-03-15: qty 2

## 2016-03-15 NOTE — ED Notes (Signed)
Patient c/o left side flank pain. Per patient woke this morning with pain. Patient denies any injury, pain or blood with urination. Patient states he is unable to have BM due to pain. Per patient has had kidney stone in past but on right side. Patient does report nausea and vomiting.

## 2016-03-15 NOTE — Discharge Instructions (Signed)
I have given you a copy of your results - please share these with your doctor on Monday - I have given you a strainer for your urine - please use this to catch the stone  If you have severe pain / vomiting, return to the ER

## 2016-03-15 NOTE — ED Provider Notes (Signed)
CSN: [id number]     Arrival date & time 03/15/16  1252 History   First MD Initiated Contact with Patient 03/15/16 1305     Chief Complaint  Patient presents with  . Flank Pain     (Consider location/radiation/quality/duration/timing/severity/associated sxs/prior Treatment) HPI Comments: The patient is a 40 year old male, he has a history of prior kidney stone in the past which he passed spontaneously, presents with several hours of acute onset of left-sided left lower quadrant and left flank pain with some radiation into the scrotum on the left and the flank on the left. He has had some nausea, feels as though he cannot get into a comfortable position, he was trying to have a bowel movement this morning but was unable to pass any stool. The symptoms are similar to his kidney stone in the past, they are intermittent, fluctuating, nothing seems to make it better or worse.  Patient is a 40 y.o. male presenting with flank pain. The history is provided by the patient.  Flank Pain    Past Medical History  Diagnosis Date  . Kidney calculi    History reviewed. No pertinent past surgical history. No family history on file. Social History  Substance Use Topics  . Smoking status: Current Some Day Smoker -- 0.50 packs/day    Types: Cigarettes  . Smokeless tobacco: Never Used  . Alcohol Use: Yes     Comment: occ    Review of Systems  Genitourinary: Positive for flank pain.  All other systems reviewed and are negative.     Allergies  Review of patient's allergies indicates no known allergies.  Home Medications   Prior to Admission medications   Medication Sig Start Date End Date Taking? Authorizing Provider  ibuprofen (ADVIL,MOTRIN) 800 MG tablet Take 1 tablet (800 mg total) by mouth 3 (three) times daily. 03/15/16   Eber Hong, MD  ondansetron (ZOFRAN ODT) 4 MG disintegrating tablet  ODT q4 hours prn nausea/vomit Patient not taking: Reported on 03/15/2016 07/22/15   Bethann Berkshire,  MD  oxyCODONE-acetaminophen (PERCOCET) 5-325 MG tablet Take 1 tablet by mouth every 4 (four) hours as needed. 03/15/16   Eber Hong, MD  promethazine (PHENERGAN) 25 MG tablet Take 1 tablet (25 mg total) by mouth every 6 (six) hours as needed for nausea or vomiting. 03/15/16   Eber Hong, MD  tamsulosin (FLOMAX) 0.4 MG CAPS capsule Take 1 capsule (0.4 mg total) by mouth 2 (two) times daily. 03/15/16   Eber Hong, MD   BP 136/106 mmHg  Pulse 69  Temp(Src) 98.2 F (36.8 C) (Oral)  Resp 18  Ht  (1.803 m)  Wt 310 lb (140.615 kg)  BMI 43.26 kg/m2  SpO2 99% Physical Exam  Constitutional: He appears well-developed and well-nourished. No distress.  HENT:  Head: Normocephalic and atraumatic.  Mouth/Throat: Oropharynx is clear and moist. No oropharyngeal exudate.  Eyes: Conjunctivae and EOM are normal. Pupils are equal, round, and reactive to light. Right eye exhibits no discharge. Left eye exhibits no discharge. No scleral icterus.  Neck: Normal range of motion. Neck supple. No JVD present. No thyromegaly present.  Cardiovascular: Normal rate, regular rhythm, normal heart sounds and intact distal pulses.  Exam reveals no gallop and no friction rub.   No murmur heard. Pulmonary/Chest: Effort normal and breath sounds normal. No respiratory distress. He has no wheezes. He has no rales.  Abdominal: Soft. Bowel sounds are normal. He exhibits no distension and no mass. There is no tenderness.  No reproducible tenderness  over the abdominal wall or the flank  Genitourinary:  Normal-appearing scrotum and testicles without hernia or abnormal appearance, no swelling, no redness, no tenderness, no masses  Musculoskeletal: Normal range of motion. He exhibits no edema or tenderness.  Lymphadenopathy:    He has no cervical adenopathy.  Neurological: He is alert. Coordination normal.  Skin: Skin is warm and dry. No rash noted. No erythema.  Psychiatric: He has a normal mood and affect. His behavior is  normal.  Nursing note and vitals reviewed.   ED Course  Procedures (including critical care time) Labs Review Labs Reviewed  URINALYSIS, ROUTINE W REFLEX MICROSCOPIC (NOT AT [facility]) - Abnormal; Notable for the following:    Color, Urine AMBER (*)    APPearance CLOUDY (*)    Specific Gravity, Urine >1.030 (*)    Hgb urine dipstick LARGE (*)    Ketones, ur TRACE (*)    Protein, ur 100 (*)    All other components within normal limits  URINE MICROSCOPIC-ADD ON - Abnormal; Notable for the following:    Squamous Epithelial / LPF 0-5 (*)    Bacteria, UA MANY (*)    All other components within normal limits  URINE CULTURE    Imaging Review Ct Renal Stone Study  03/15/2016  CLINICAL DATA:  One day history of left flank pain EXAM: CT ABDOMEN AND PELVIS WITHOUT CONTRAST TECHNIQUE: Multidetector CT imaging of the abdomen and pelvis was performed following the standard protocol without oral or intravenous contrast material administration. COMPARISON:  July 22, 2015 FINDINGS: Lower chest: There is a nodular opacity in the right breast on axial slice 1 series 2 measuring 2.8 x 2.2 cm. Lung bases are clear. Hepatobiliary: No focal liver lesions are identified on this noncontrast enhanced study. Gallbladder wall is not appreciably thickened. There is no biliary duct dilatation. Pancreas: There is no pancreatic mass or inflammatory focus. Spleen: No splenic lesions are evident. Adrenals/Urinary Tract: Adrenals appear normal bilaterally. There is no renal mass on either side. The left kidney does show edema, however. There is no hydronephrosis the right. There is moderate hydronephrosis on the left. Three no intrarenal calculi on the right. There is no right-sided ureteral calculus. On the left, there is a nonobstructing 1 mm calculus in the mid kidney with a second nearby 1 mm calculus in this same area. There is a calculus in the mid left ureter measuring 6 x 4 mm. No other ureteral calculus is evident. The  urinary bladder is midline with wall thickness within normal limits. Urinary bladder is largely decompressed. Stomach/Bowel: There are scattered sigmoid diverticula without diverticulitis. There is no bowel wall or mesenteric thickening. There is no bowel obstruction. No free air or portal venous air. Vascular/Lymphatic: There is no abdominal aortic aneurysm. No vascular lesions are evident on this noncontrast enhanced study. There is no adenopathy in the abdomen or pelvis. Reproductive: Prostate and seminal vesicles appear unremarkable. There is no pelvic mass or pelvic fluid collection. Other: Appendix appears normal. There is no ascites or abscess in the abdomen or pelvis. There is a minimal ventral hernia containing only fat. Musculoskeletal: There are no blastic or lytic bone lesions. There is no intramuscular or abdominal wall lesion. IMPRESSION: 6 x 4 mm calculus mid left ureter with moderate hydronephrosis on the left. There is moderate left renal edema. There are also 1 mm calculi in the mid left kidney, nonobstructing. Nodular area right breast, incompletely visualized. Statistically, this lesion most likely represents focal gynecomastia. Clinical assessment in this  regard advised. If this area does not clearly represent gynecomastia on physical examination, sonographic correlation may be reasonable. Minimal ventral hernia containing only fat. Scattered sigmoid diverticula without diverticulitis. No bowel obstruction. No abscess. Appendix appears normal. Electronically Signed   By: Bretta Bang III M.D.   On: 03/15/2016 14:32   I have personally reviewed and evaluated these images and lab results as part of my medical decision-making.   EKG Interpretation None      MDM   Final diagnoses:  Kidney stone    Well-appearing, has appearance consistent with possible kidney stone, CT renal study ordered, Toradol, urinalysis.  Hypertension - will recheck after meds  Explained results to pt -  6X4 stone - strainer given,  Tolerated meds.  Eber Hong, MD 03/15/16 [phone]

## 2016-03-17 LAB — URINE CULTURE

## 2016-10-11 IMAGING — CT CT RENAL STONE PROTOCOL
2 of 4 series · 17 of 46 positions shown, 19 images · non-contrast
Comparison: 11/17/2012

CLINICAL DATA: Left flank pain beginning this morning with
hematuria.

EXAM:
CT ABDOMEN AND PELVIS WITHOUT CONTRAST
TECHNIQUE: Multidetector CT imaging of the abdomen and pelvis was performed
following the standard protocol without IV contrast.

[Series 2: standard/full over (age)lbs 5.0 · axial · 0.96mm/px · z∈[-500,-16]mm · 14 of 107 slices shown, 16 images]
[im 5/107  soft-tissue]
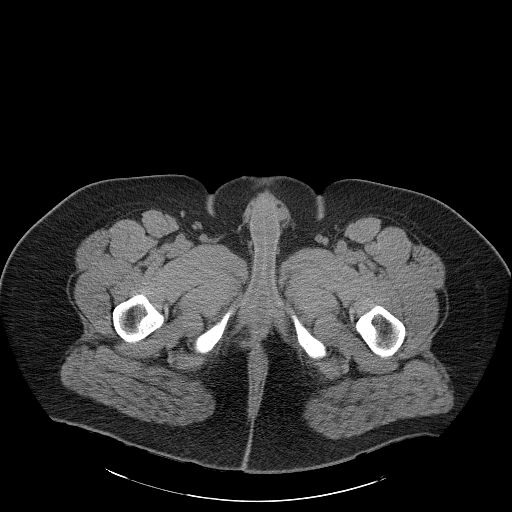
[im 5/107  bone]
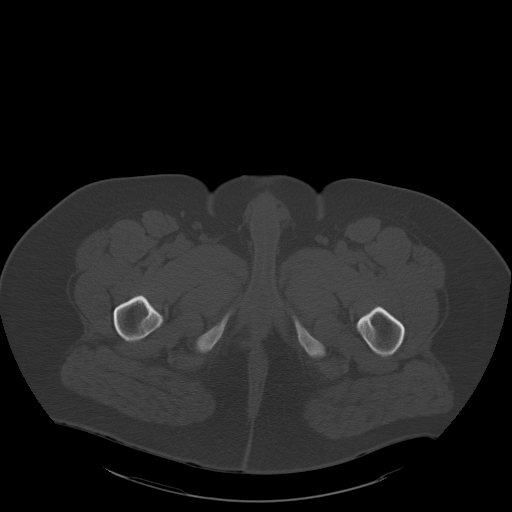
[im 14/107  soft-tissue]
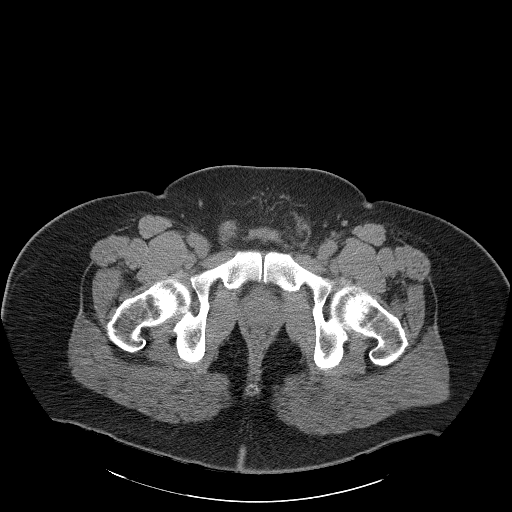
[im 19/107  soft-tissue]
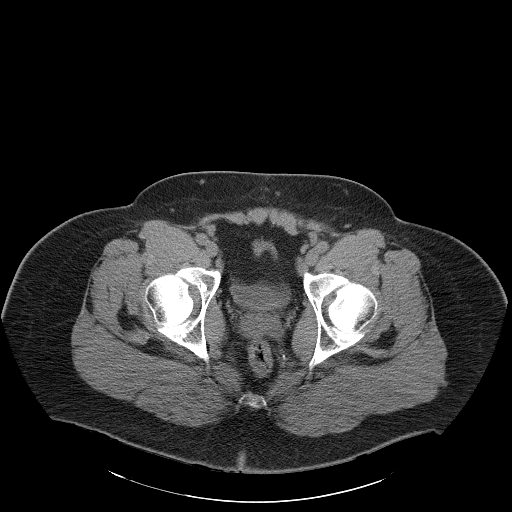
[im 28/107  soft-tissue]
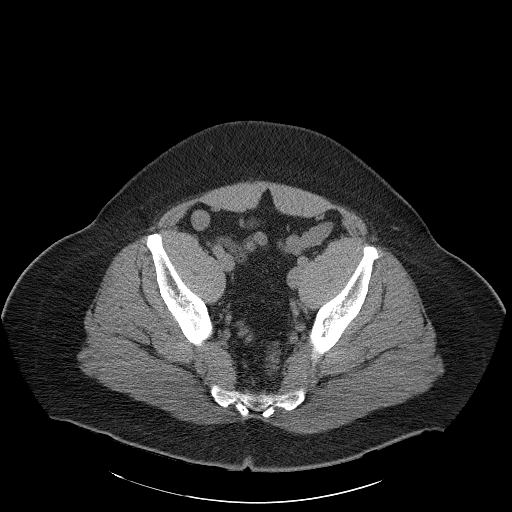
[im 37/107  soft-tissue]
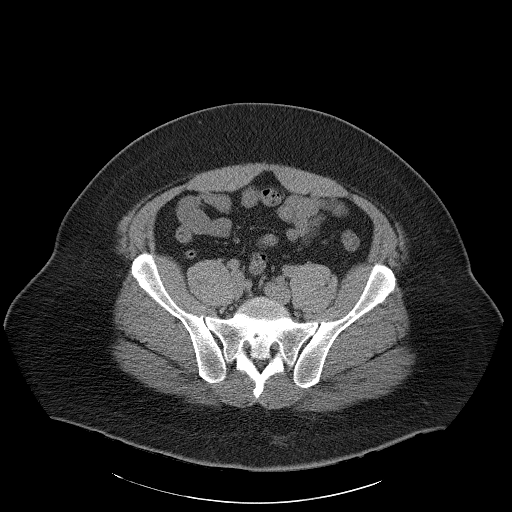
[im 42/107  soft-tissue]
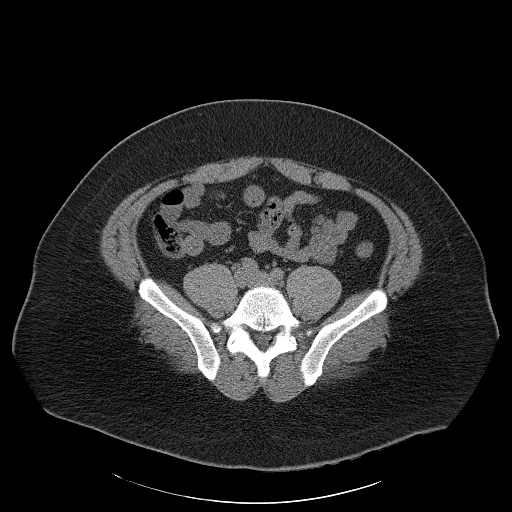
[im 51/107  soft-tissue]
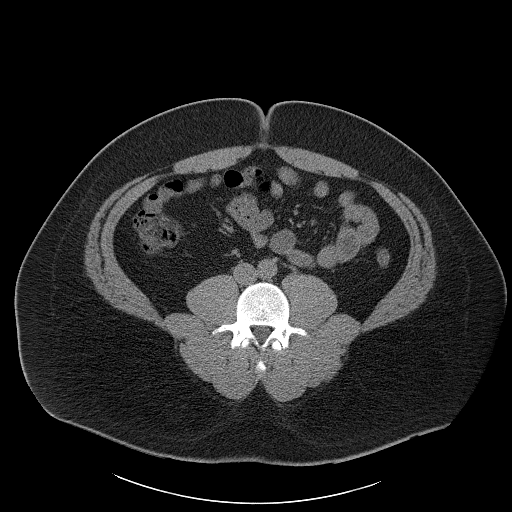
[im 56/107  soft-tissue]
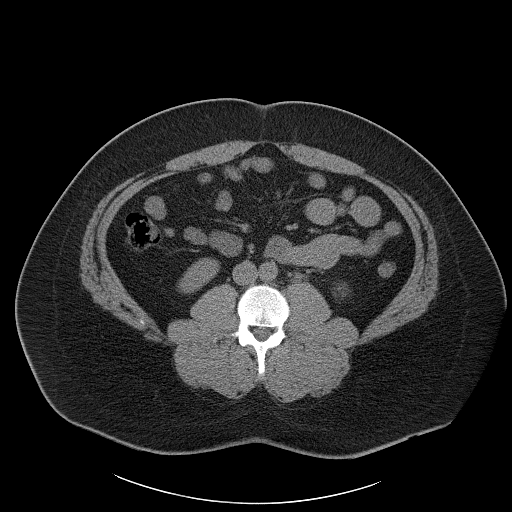
[im 65/107  soft-tissue]
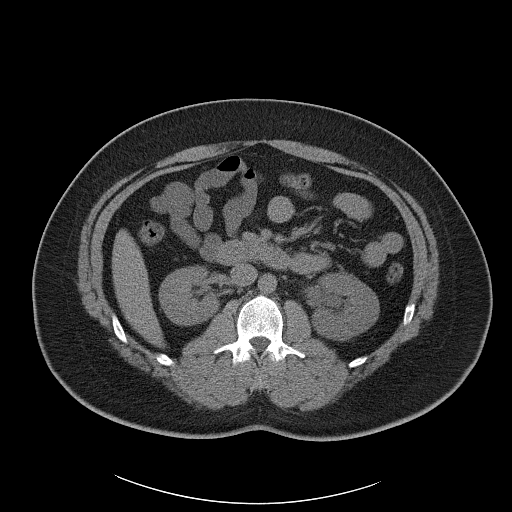
[im 65/107  bone]
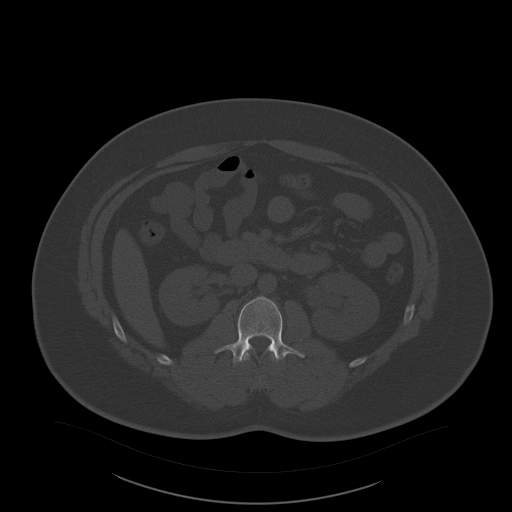
[im 70/107  soft-tissue]
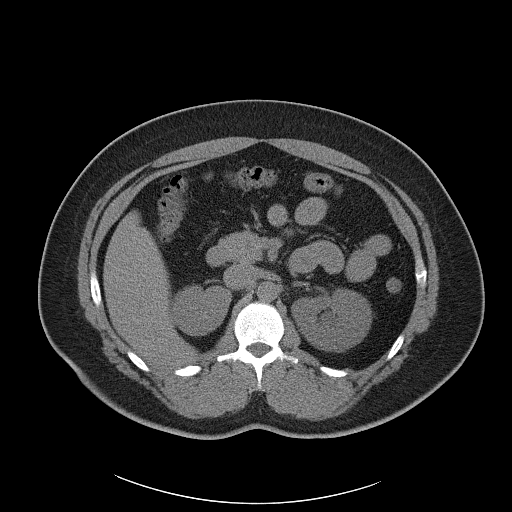
[im 79/107  soft-tissue]
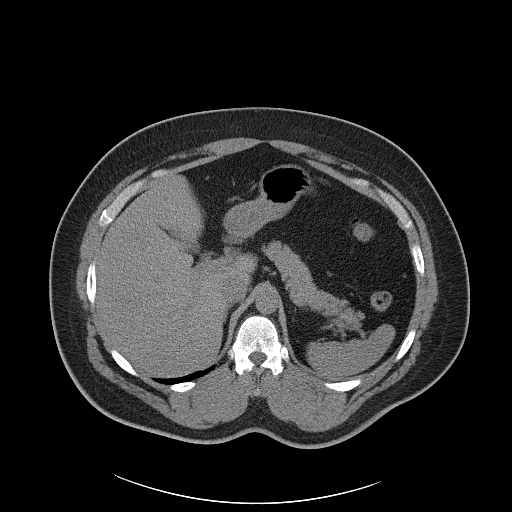
[im 88/107  soft-tissue]
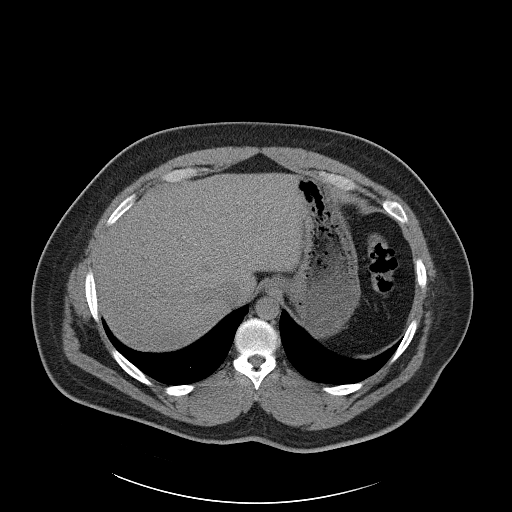
[im 93/107  soft-tissue]
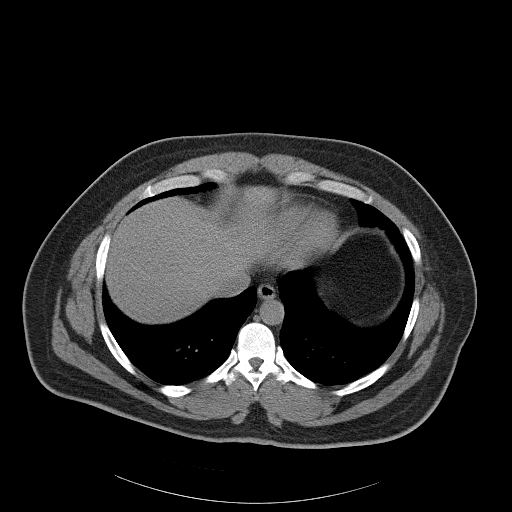
[im 102/107  soft-tissue]
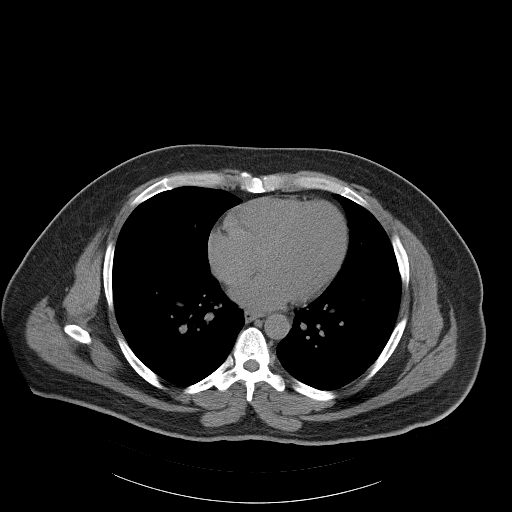

[Series 3: mpr coronal · coronal · 0.94mm/px · 3 of 122 slices shown]
[im 41/122  soft-tissue]
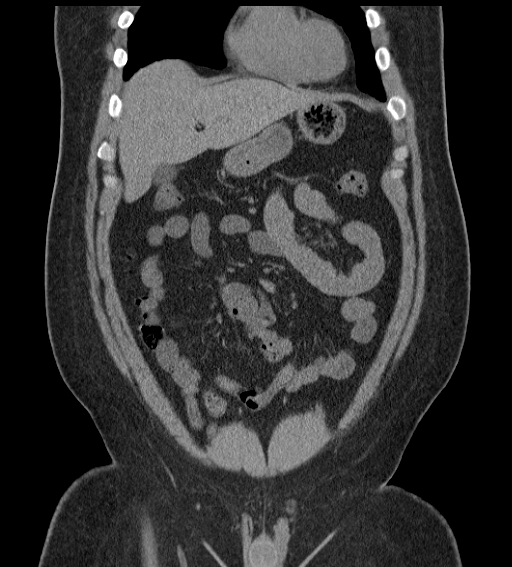
[im 54/122  soft-tissue]
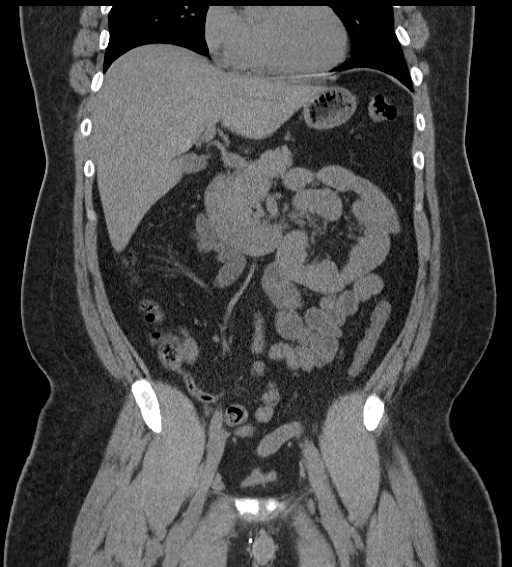
[im 68/122  soft-tissue]
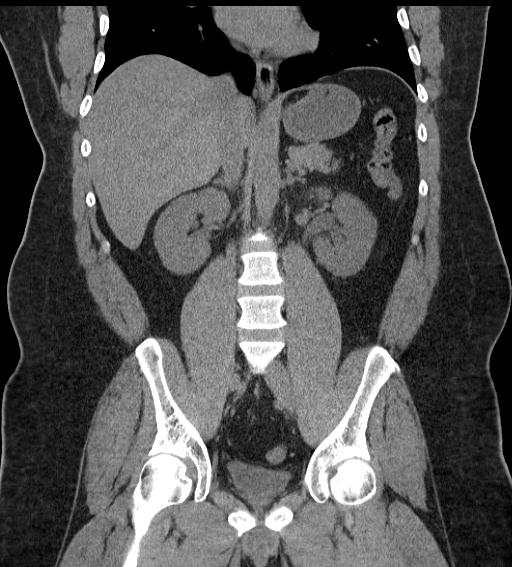

[17 of 46 positions shown; findings below may reference images not displayed]

FINDINGS: Lung bases are clear. No pleural or pericardial fluid. The liver
appears normal without contrast. No calcified gallstones. The spleen
is normal. The pancreas is normal. The adrenal glands are normal.
The right kidney is normal. No cyst, mass, stone or hydronephrosis.
The left kidney is swollen and there is moderate left
hydronephrosis. There is a nonobstructing 2 mm stone in the lower
pole of left kidney. There is an obstructing stone in the proximal
ureter measuring 6 x 10 mm. No stone distal to that. No stone in the
bladder.

The aorta and IVC are normal. No retroperitoneal adenopathy or mass.
No intra peritoneal fluid or air. No bowel pathology.
IMPRESSION: Obstructing stone in the proximal left ureter measuring 6 x 10 mm
with moderate left hydronephrosis.

## 2017-05-12 ENCOUNTER — Emergency Department (HOSPITAL_COMMUNITY)
Admission: EM | Admit: 2017-05-12 | Discharge: 2017-05-12 | Disposition: A | Discharge status: Home / Self Care | Payer: Self-pay | Attending: Emergency Medicine | Admitting: Emergency Medicine

## 2017-05-12 ENCOUNTER — Encounter (HOSPITAL_COMMUNITY): Payer: Self-pay | Admitting: *Deleted

## 2017-05-12 DIAGNOSIS — R112 Nausea with vomiting, unspecified: Secondary | ICD-10-CM | POA: Insufficient documentation

## 2017-05-12 DIAGNOSIS — R197 Diarrhea, unspecified: Secondary | ICD-10-CM

## 2017-05-12 DIAGNOSIS — F17210 Nicotine dependence, cigarettes, uncomplicated: Secondary | ICD-10-CM | POA: Insufficient documentation

## 2017-05-12 LAB — I-STAT CHEM 8, ED
BUN: 23 mg/dL — ABNORMAL HIGH (ref 6–20)
CHLORIDE: 111 mmol/L (ref 101–111)
CREATININE: 0.9 mg/dL (ref 0.61–1.24)
Calcium, Ion: 1.02 mmol/L — ABNORMAL LOW (ref 1.15–1.40)
GLUCOSE: 95 mg/dL (ref 65–99)
HEMATOCRIT: 42 % (ref 39.0–52.0)
Hemoglobin: 14.3 g/dL (ref 13.0–17.0)
POTASSIUM: 3.6 mmol/L (ref 3.5–5.1)
Sodium: 143 mmol/L (ref 135–145)
TCO2: 21 mmol/L (ref 0–100)

## 2017-05-12 MED ORDER — SODIUM CHLORIDE 0.9 % IV BOLUS (SEPSIS)
1000.0000 mL | Freq: Once | INTRAVENOUS | Status: AC
  Administered 2017-05-12: 1000 mL via INTRAVENOUS

## 2017-05-12 MED ORDER — LOPERAMIDE HCL 2 MG PO CAPS
4.0000 mg | ORAL_CAPSULE | Freq: Once | ORAL | Status: AC
  Administered 2017-05-12: 4 mg via ORAL
  Filled 2017-05-12: qty 2

## 2017-05-12 MED ORDER — ONDANSETRON HCL 4 MG/2ML IJ SOLN
4.0000 mg | Freq: Once | INTRAMUSCULAR | Status: AC
  Administered 2017-05-12: 4 mg via INTRAVENOUS
  Filled 2017-05-12: qty 2

## 2017-05-12 MED ORDER — ACETAMINOPHEN 325 MG PO TABS
650.0000 mg | ORAL_TABLET | Freq: Once | ORAL | Status: AC
  Administered 2017-05-12: 650 mg via ORAL
  Filled 2017-05-12: qty 2

## 2017-05-12 MED ORDER — LOPERAMIDE HCL 2 MG PO CAPS: 2.0000 mg | ORAL_CAPSULE | Freq: Four times a day (QID) | ORAL | 0 refills | Status: AC | PRN

## 2017-05-12 MED ORDER — ONDANSETRON 4 MG PO TBDP: 4.0000 mg | ORAL_TABLET | Freq: Three times a day (TID) | ORAL | 0 refills | Status: AC | PRN

## 2017-05-12 NOTE — ED Notes (Signed)
Pt took PO meds with no problems able to hold down water.

## 2017-05-12 NOTE — ED Triage Notes (Signed)
Pt c/o n/v/d and generalized weakness and dizziness that started last night while at work

## 2017-05-12 NOTE — ED Provider Notes (Signed)
[facility]-EMERGENCY DEPT Provider Note   CSN: [id number] Arrival date & time: 05/12/17  0453     History   Chief Complaint Chief Complaint  Patient presents with  . Emesis    HPI Darrell Moss is a 41 y.o. male.  HPI  This a 41 year old male with a history of renal stones who presents with nausea, vomiting, and diarrhea. Onset of symptoms yesterday. Patient states he left work early after he vomited once. He reports multiple episodes of nonbilious, nonbloody emesis. He denies any abdominal pain. He does report watery diarrhea. No known sick contacts. He reports chills at home and myalgias. He has not taken anything for his symptoms.  Past Medical History:  Diagnosis Date  . Kidney calculi     There are no active problems to display for this patient.   History reviewed. No pertinent surgical history.     Home Medications    Prior to Admission medications   Medication Sig Start Date End Date Taking? Authorizing Provider  loperamide (IMODIUM) 2 MG capsule Take 1 capsule (2 mg total) by mouth 4 (four) times daily as needed for diarrhea or loose stools. 05/12/17   Mayah Urquidi, Mayer Maskerourtney F, MD  ondansetron (ZOFRAN ODT) 4 MG disintegrating tablet Take 1 tablet (4 mg total) by mouth every 8 (eight) hours as needed for nausea or vomiting. 05/12/17   Fama Muenchow, Mayer Maskerourtney F, MD    Family History History reviewed. No pertinent family history.  Social History Social History  Substance Use Topics  . Smoking status: Current Some Day Smoker    Packs/day: 0.25    Types: Cigarettes  . Smokeless tobacco: Never Used  . Alcohol use Yes     Comment: occ     Allergies   Patient has no known allergies.   Review of Systems Review of Systems  Constitutional: Positive for chills. Negative for fever.  Respiratory: Negative for shortness of breath.   Cardiovascular: Negative for chest pain.  Gastrointestinal: Positive for diarrhea, nausea and vomiting. Negative for abdominal pain.    Musculoskeletal: Positive for myalgias.  All other systems reviewed and are negative.    Physical Exam Updated Vital Signs BP (!) 144/104 (BP Location: Left Arm)   Pulse (!) 123   Temp 100 F (37.8 C) (Oral)   Resp 20   Ht 5\' 11"  (1.803 m)   Wt 129.3 kg (285 lb)   SpO2 96%   BMI 39.75 kg/m   Physical Exam  Constitutional: He is oriented to person, place, and time. He appears well-developed and well-nourished.  Overweight, no acute distress  HENT:  Head: Normocephalic and atraumatic.  Cardiovascular: Regular rhythm and normal heart sounds.   No murmur heard. Tachycardia  Pulmonary/Chest: Effort normal and breath sounds normal. No respiratory distress. He has no wheezes.  Abdominal: Soft. Bowel sounds are normal. There is no tenderness. There is no rebound.  Musculoskeletal: He exhibits no edema.  Neurological: He is alert and oriented to person, place, and time.  Skin: Skin is warm and dry.  Psychiatric: He has a normal mood and affect.  Nursing note and vitals reviewed.    ED Treatments / Results  Labs (all labs ordered are listed, but only abnormal results are displayed) Labs Reviewed  I-STAT CHEM 8, ED - Abnormal; Notable for the following:       Result Value   BUN 23 (*)    Calcium, Ion 1.02 (*)    All other components within normal limits    EKG  EKG Interpretation  None       Radiology No results found.  Procedures Procedures (including critical care time)  Medications Ordered in ED Medications  sodium chloride 0.9 % bolus 1,000 mL (1,000 mLs Intravenous New Bag/Given 05/12/17 0600)  ondansetron (ZOFRAN) injection 4 mg (4 mg Intravenous Given 05/12/17 0559)  loperamide (IMODIUM) capsule 4 mg (4 mg Oral Given 05/12/17 0601)  acetaminophen (TYLENOL) tablet 650 mg (650 mg Oral Given 05/12/17 0600)     Initial Impression / Assessment and Plan / ED Course  I have reviewed the triage vital signs and the nursing notes.  Pertinent labs & imaging  results that were available during my care of the patient were reviewed by me and considered in my medical decision making (see chart for details).     Patient presents with nausea, vomiting, diarrhea, body aches. Nontoxic on exam. Temperature 100.0.  Heart rate 123. He is otherwise nontoxic. Patient was given fluids, Zofran, Imodium. Physical exam is fairly benign. Suspect acute gastroenteritis. Chem-8 obtained. Renal function preserved. Otherwise reassuring. Patient received 1 L of fluids. Vital signs stabilized. Patient reports improvement of symptoms.  Recommend supportive measures at home.  After history, exam, and medical workup I feel the patient has been appropriately medically screened and is safe for discharge home. Pertinent diagnoses were discussed with the patient. Patient was given return precautions.   Final Clinical Impressions(s) / ED Diagnoses   Final diagnoses:  Nausea vomiting and diarrhea    New Prescriptions New Prescriptions   LOPERAMIDE (IMODIUM) 2 MG CAPSULE    Take 1 capsule (2 mg total) by mouth 4 (four) times daily as needed for diarrhea or loose stools.   ONDANSETRON (ZOFRAN ODT) 4 MG DISINTEGRATING TABLET    Take 1 tablet (4 mg total) by mouth every 8 (eight) hours as needed for nausea or vomiting.     Shon BatonHorton, Maye Parkinson F, MD 05/12/17 0730

## 2017-05-19 ENCOUNTER — Encounter (HOSPITAL_COMMUNITY): Payer: Self-pay | Admitting: *Deleted

## 2017-05-19 ENCOUNTER — Emergency Department (HOSPITAL_COMMUNITY)
Admission: EM | Admit: 2017-05-19 | Discharge: 2017-05-19 | Disposition: A | Discharge status: Home / Self Care | Payer: Self-pay | Attending: Emergency Medicine | Admitting: Emergency Medicine

## 2017-05-19 DIAGNOSIS — S39012A Strain of muscle, fascia and tendon of lower back, initial encounter: Secondary | ICD-10-CM | POA: Insufficient documentation

## 2017-05-19 DIAGNOSIS — X500XXA Overexertion from strenuous movement or load, initial encounter: Secondary | ICD-10-CM | POA: Insufficient documentation

## 2017-05-19 DIAGNOSIS — Y929 Unspecified place or not applicable: Secondary | ICD-10-CM | POA: Insufficient documentation

## 2017-05-19 DIAGNOSIS — Y999 Unspecified external cause status: Secondary | ICD-10-CM | POA: Insufficient documentation

## 2017-05-19 DIAGNOSIS — F17210 Nicotine dependence, cigarettes, uncomplicated: Secondary | ICD-10-CM | POA: Insufficient documentation

## 2017-05-19 DIAGNOSIS — Y9389 Activity, other specified: Secondary | ICD-10-CM | POA: Insufficient documentation

## 2017-05-19 MED ORDER — KETOROLAC TROMETHAMINE 60 MG/2ML IM SOLN
60.0000 mg | Freq: Once | INTRAMUSCULAR | Status: AC
  Administered 2017-05-19: 60 mg via INTRAMUSCULAR
  Filled 2017-05-19: qty 2

## 2017-05-19 MED ORDER — METHOCARBAMOL 500 MG PO TABS: 500.0000 mg | ORAL_TABLET | Freq: Two times a day (BID) | ORAL | 0 refills | Status: AC

## 2017-05-19 MED ORDER — DICLOFENAC SODIUM 50 MG PO TBEC: 50.0000 mg | DELAYED_RELEASE_TABLET | Freq: Two times a day (BID) | ORAL | 0 refills | Status: AC

## 2017-05-19 NOTE — ED Notes (Signed)
Pt states was at work lifted TV & hurt his lower back. Pain increased w/ movement.

## 2017-05-19 NOTE — ED Provider Notes (Signed)
[facility] DEPT Provider Note   CSN: [id number] Arrival date & time: 05/19/17  2101     History   Chief Complaint Chief Complaint  Patient presents with  . Back Pain    HPI Darrell Moss is a 41 y.o. male.  The history is provided by the patient. No language interpreter was used.  Back Pain   This is a new problem. The current episode started more than 1 week ago. The problem occurs constantly. The problem has not changed since onset.The pain is associated with no known injury. The pain is present in the lumbar spine. The quality of the pain is described as aching. The pain is moderate. The symptoms are aggravated by twisting. The pain is worse during the day. Pertinent negatives include no abdominal pain, no paresthesias and no paresis. He has tried nothing for the symptoms. The treatment provided no relief.  Pt complains of pain in low back after lifting a tv.   Past Medical History:  Diagnosis Date  . Kidney calculi     There are no active problems to display for this patient.   History reviewed. No pertinent surgical history.     Home Medications    Prior to Admission medications   Medication Sig Start Date End Date Taking? Authorizing Provider  diclofenac (VOLTAREN) 50 MG EC tablet Take 1 tablet (50 mg total) by mouth 2 (two) times daily. 05/19/17   Elson Areas, PA-C  loperamide (IMODIUM) 2 MG capsule Take 1 capsule (2 mg total) by mouth 4 (four) times daily as needed for diarrhea or loose stools. 05/12/17   Horton, Mayer Masker, MD  methocarbamol (ROBAXIN) 500 MG tablet Take 1 tablet (500 mg total) by mouth 2 (two) times daily. 05/19/17   Elson Areas, PA-C  ondansetron (ZOFRAN ODT) 4 MG disintegrating tablet Take 1 tablet (4 mg total) by mouth every 8 (eight) hours as needed for nausea or vomiting. 05/12/17   Horton, Mayer Masker, MD    Family History No family history on file.  Social History Social History  Substance Use Topics  . Smoking status: Current  Some Day Smoker    Packs/day: 0.25    Types: Cigarettes  . Smokeless tobacco: Never Used  . Alcohol use Yes     Comment: occ     Allergies   Patient has no known allergies.   Review of Systems Review of Systems  Gastrointestinal: Negative for abdominal pain.  Musculoskeletal: Positive for back pain.  Neurological: Negative for paresthesias.  All other systems reviewed and are negative.    Physical Exam Updated Vital Signs BP (!) 150/100   Pulse 68   Temp 98.5 F (36.9 C)   Resp 20   Ht 5\' 11"  (1.803 m)   Wt 129.3 kg (285 lb)   SpO2 96%   BMI 39.75 kg/m   Physical Exam  Constitutional: He appears well-developed and well-nourished.  HENT:  Head: Normocephalic.  Cardiovascular: Normal rate.   Pulmonary/Chest: Effort normal.  Abdominal: Soft.  Musculoskeletal: Normal range of motion.  Neurological: He is alert.  Skin: Skin is warm.  Psychiatric: He has a normal mood and affect.  Nursing note and vitals reviewed.    ED Treatments / Results  Labs (all labs ordered are listed, but only abnormal results are displayed) Labs Reviewed - No data to display  EKG  EKG Interpretation None       Radiology No results found.  Procedures Procedures (including critical care time)  Medications Ordered in  ED Medications  ketorolac (TORADOL) injection 60 mg (60 mg Intramuscular Given 05/19/17 2223)     Initial Impression / Assessment and Plan / ED Course  I have reviewed the triage vital signs and the nursing notes.  Pertinent labs & imaging results that were available during my care of the patient were reviewed by me and considered in my medical decision making (see chart for details).     Pt given torodol IM.    Final Clinical Impressions(s) / ED Diagnoses   Final diagnoses:  Strain of lumbar region, initial encounter    New Prescriptions Discharge Medication List as of 05/19/2017 10:16 PM    START taking these medications   Details  diclofenac  (VOLTAREN) 50 MG EC tablet Take 1 tablet (50 mg total) by mouth 2 (two) times daily., Starting Tue 05/19/2017, Print    methocarbamol (ROBAXIN) 500 MG tablet Take 1 tablet (500 mg total) by mouth 2 (two) times daily., Starting Tue 05/19/2017, Print      An After Visit Summary was printed and given to the patient.    Osie CheeksSofia, Makailah Slavick K, PA-C 05/19/17 2341    Eber HongMiller, Brian, MD 05/20/17 [phone]

## 2017-05-19 NOTE — ED Triage Notes (Signed)
Pain in lower back, states he injured back at work tonight picking up a TV

## 2017-05-19 NOTE — ED Notes (Signed)
Pt alert & oriented x4, stable gait. Patient given discharge instructions, paperwork & prescription(s). Patient  instructed to stop at the registration desk to finish any additional paperwork. Patient verbalized understanding. Pt left department w/ no further questions.
